# Patient Record
Sex: Male | Born: 1995 | Race: White | Hispanic: No | Marital: Single | Smoking: Never smoker
Health system: Southern US, Community
[De-identification: ages and names within clinical notes are randomized; demographics above are authoritative.]

---

## 2016-05-12 DIAGNOSIS — J0300 Acute streptococcal tonsillitis, unspecified: Secondary | ICD-10-CM | POA: Diagnosis not present

## 2016-05-12 DIAGNOSIS — J00 Acute nasopharyngitis [common cold]: Secondary | ICD-10-CM | POA: Diagnosis not present

## 2016-05-12 DIAGNOSIS — H10021 Other mucopurulent conjunctivitis, right eye: Secondary | ICD-10-CM | POA: Diagnosis not present

## 2016-09-05 ENCOUNTER — Other Ambulatory Visit: Payer: Self-pay | Admitting: Family Medicine

## 2016-09-05 ENCOUNTER — Ambulatory Visit
Admission: RE | Admit: 2016-09-05 | Discharge: 2016-09-05 | Disposition: A | Discharge status: Home / Self Care | Payer: BLUE CROSS/BLUE SHIELD | Source: Ambulatory Visit | Attending: Family Medicine | Admitting: Family Medicine

## 2016-09-05 ENCOUNTER — Ambulatory Visit
Admission: RE | Admit: 2016-09-05 | Discharge: 2016-09-05 | Disposition: A | Discharge status: Home / Self Care | Payer: BLUE CROSS/BLUE SHIELD | Source: Other Acute Inpatient Hospital | Attending: Family Medicine | Admitting: Family Medicine

## 2016-09-05 DIAGNOSIS — M25572 Pain in left ankle and joints of left foot: Secondary | ICD-10-CM | POA: Insufficient documentation

## 2016-09-05 DIAGNOSIS — Q799 Congenital malformation of musculoskeletal system, unspecified: Secondary | ICD-10-CM

## 2016-09-05 DIAGNOSIS — Y9366 Activity, soccer: Secondary | ICD-10-CM | POA: Insufficient documentation

## 2016-09-05 DIAGNOSIS — X501XXA Overexertion from prolonged static or awkward postures, initial encounter: Secondary | ICD-10-CM | POA: Insufficient documentation

## 2016-09-15 ENCOUNTER — Ambulatory Visit: Payer: BLUE CROSS/BLUE SHIELD | Admitting: Family Medicine

## 2016-09-18 ENCOUNTER — Ambulatory Visit: Payer: BLUE CROSS/BLUE SHIELD | Admitting: Family Medicine

## 2016-09-18 ENCOUNTER — Ambulatory Visit (INDEPENDENT_AMBULATORY_CARE_PROVIDER_SITE_OTHER): Payer: BLUE CROSS/BLUE SHIELD | Admitting: Family Medicine

## 2016-09-18 ENCOUNTER — Encounter: Payer: Self-pay | Admitting: Family Medicine

## 2016-09-18 DIAGNOSIS — S93402A Sprain of unspecified ligament of left ankle, initial encounter: Secondary | ICD-10-CM | POA: Diagnosis not present

## 2016-09-18 MED ORDER — NAPROXEN 500 MG PO TABS: 500.0000 mg | ORAL_TABLET | Freq: Two times a day (BID) | ORAL | 0 refills | Status: DC

## 2016-09-18 NOTE — Progress Notes (Signed)
Patient presents today with symptoms of left ankle pain and swelling. Patient states that he injured his ankle while playing soccer approximately 2 weeks ago. He states that the bruising is down and so is the swelling. He however has been unable to progress with activity. He denies any previous ankle sprains in the past. He denies any feelings of instability of the ankle. He was only in a walking boot for a few days. He is not been in any other splint for his ankle. He states that he does not have any pain with walking. He has only been taking Aleve occasionally.  ROS: Negative except mentioned above.  Vitals as per Epic GENERAL: NAD RESP: CTA B CARD: RRR MSK: Left ankle - no ecchymosis appreciated, no tenderness on the medial or lateral malleolus, tenderness localized to the medial aspect of the ankle, anterior tibiofibular ligament, as well as the ATFL, negative squeeze test, slight movement with drawer testing, negative Talar tilt, no tenderness of the fifth metatarsal, full range of motion, NV intact NEURO: CN II-XII grossly intact   A/P: Left Ankle Sprain- discussed with patient that likely high ankle sprain related injury, would've liked patient to be in a walking boot or ankle splint for longer, can wear an ASO now with a supportive tennis shoe, continue rehabilitation, if continues to not make improvement will follow up with Dr. Ardine Engiehl. Will discussed plan with trainer. Naprosyn when necessary.

## 2016-09-29 DIAGNOSIS — S93492A Sprain of other ligament of left ankle, initial encounter: Secondary | ICD-10-CM | POA: Diagnosis not present

## 2016-12-23 ENCOUNTER — Encounter: Payer: Self-pay | Admitting: Family Medicine

## 2016-12-23 ENCOUNTER — Ambulatory Visit (INDEPENDENT_AMBULATORY_CARE_PROVIDER_SITE_OTHER): Payer: BLUE CROSS/BLUE SHIELD | Admitting: Family Medicine

## 2016-12-23 DIAGNOSIS — M79671 Pain in right foot: Secondary | ICD-10-CM

## 2016-12-23 NOTE — Progress Notes (Signed)
Patient presents today with symptoms of right heel pain. Patient states that 4 weeks ago he jumped from 10 steps. Patient states that he was drinking alcohol. He states that he did have some bruising of the area after the injury. He did have difficulty weightbearing on the heel for a few days. A week and a half later patient did attempt play basketball and still had pain. He is also attempted to do some soccer activities and has had pain. He states the pain is improved but he still feels it. Denies any other injury. Patient is wearing heel cups which have helped his discomfort. Denies any previous injury to the heel in the past.  ROS: Negative except mentioned above. Vitals as per Epic.  GENERAL: NAD RESP: CTA B CARD: RRR MSK: Right heel - no ecchymosis or swelling appreciated, patient has discomfort along the lateral aspect of the calcaneus, he also has some discomfort along the lateral plantar surface of the calcaneus, no discomfort at the insertion of the plantar fascia on the calcaneus, full range of motion, NV intact NEURO: CN II-XII grossly intact   A/P: Right Calcaneus Injury -will evaluate with x-ray initially, will consider doing further imaging with CT or MRI, patient is having improvement with wearing the heel cup so he can continue this at this time, no athletic activity for now, will discuss plan with trainer.

## 2016-12-24 ENCOUNTER — Ambulatory Visit
Admission: RE | Admit: 2016-12-24 | Discharge: 2016-12-24 | Disposition: A | Discharge status: Home / Self Care | Payer: BLUE CROSS/BLUE SHIELD | Source: Ambulatory Visit | Attending: Family Medicine | Admitting: Family Medicine

## 2016-12-24 DIAGNOSIS — M79671 Pain in right foot: Secondary | ICD-10-CM

## 2017-01-12 ENCOUNTER — Ambulatory Visit: Payer: BLUE CROSS/BLUE SHIELD | Admitting: Family Medicine

## 2017-01-15 ENCOUNTER — Ambulatory Visit (INDEPENDENT_AMBULATORY_CARE_PROVIDER_SITE_OTHER): Payer: BLUE CROSS/BLUE SHIELD | Admitting: Family Medicine

## 2017-01-15 ENCOUNTER — Encounter: Payer: Self-pay | Admitting: Family Medicine

## 2017-01-15 DIAGNOSIS — M25572 Pain in left ankle and joints of left foot: Secondary | ICD-10-CM

## 2017-01-15 NOTE — Progress Notes (Signed)
Patient presents today for follow-up regarding left ankle injury that happened back in September. Patient describes the injury as a high ankle sprain. He states that he messed up to 10 games due to the ankle sprain. He states that his pain now happens on occasion and mostly happens when he is striking a ball. He has no pain with running or cutting. He denies any swelling of the ankle are decreased range of motion that he has noticed since the injury. He has taped the ankle for soccer which he seems helps some. Patient states that he did do rehabilitation initially for his ankle that has not continued to do this now. He states that his right heel pain is improving slowly. He still declines to have any further imaging such as MRI since it was not related to an athletic injury.  ROS: Negative except mentioned above.  GENERAL: NAD MSK: Left Ankle - no gross deformity, no ecchymosis or swelling appreciated, full range of motion, no tenderness to palpation, negative drawer, negative talar tilt, NV intact NEURO: CN II-XII grossly intact   A/P: Left ankle pain with previous history of high ankle sprain during the fall - discussed with patient that this time I would recommend him seeing PT and working on ankle rehabilitation, discussed using ASO versus taping, NSAIDs when necessary, if symptoms do not continue to improve with above over the next few weeks would recommend doing further imaging with MRI and seeing Dr. Ardine Engiehl.

## 2017-08-02 ENCOUNTER — Emergency Department: Payer: BLUE CROSS/BLUE SHIELD

## 2017-08-02 ENCOUNTER — Emergency Department
Admission: EM | Admit: 2017-08-02 | Discharge: 2017-08-02 | Disposition: A | Discharge status: Home / Self Care | Payer: BLUE CROSS/BLUE SHIELD | Attending: Emergency Medicine | Admitting: Emergency Medicine

## 2017-08-02 DIAGNOSIS — X58XXXA Exposure to other specified factors, initial encounter: Secondary | ICD-10-CM | POA: Diagnosis not present

## 2017-08-02 DIAGNOSIS — Y929 Unspecified place or not applicable: Secondary | ICD-10-CM | POA: Insufficient documentation

## 2017-08-02 DIAGNOSIS — Y9366 Activity, soccer: Secondary | ICD-10-CM | POA: Diagnosis not present

## 2017-08-02 DIAGNOSIS — M7989 Other specified soft tissue disorders: Secondary | ICD-10-CM | POA: Diagnosis not present

## 2017-08-02 DIAGNOSIS — Y999 Unspecified external cause status: Secondary | ICD-10-CM | POA: Diagnosis not present

## 2017-08-02 DIAGNOSIS — S90911A Unspecified superficial injury of right ankle, initial encounter: Secondary | ICD-10-CM | POA: Diagnosis present

## 2017-08-02 DIAGNOSIS — M25571 Pain in right ankle and joints of right foot: Secondary | ICD-10-CM | POA: Diagnosis not present

## 2017-08-02 DIAGNOSIS — S93401A Sprain of unspecified ligament of right ankle, initial encounter: Secondary | ICD-10-CM | POA: Insufficient documentation

## 2017-08-02 DIAGNOSIS — S99911A Unspecified injury of right ankle, initial encounter: Secondary | ICD-10-CM | POA: Diagnosis not present

## 2017-08-02 NOTE — ED Notes (Signed)
First Nurse: pt with ankle pain, ambulatory to stat desk with no difficulty noted.

## 2017-08-02 NOTE — ED Triage Notes (Signed)
Pt states playing in a soccer game last night and hurting his ankle.  Upon waking this morning the pain is worse.  Pt wanted to get some X-rays of ankle.

## 2017-08-02 NOTE — Discharge Instructions (Signed)
Ice and elevate her ankle as needed for comfort. Wear your splint that you have at your facility and use crutches as needed for walking. Follow-up with Dr. Allena Katz for your ankle injury and pain. Begin using ibuprofen and continue until seen by Dr. Allena Katz.

## 2017-08-02 NOTE — ED Provider Notes (Signed)
[facility] Emergency Department Provider Note  ____________________________________________   None    (approximate)  I have reviewed the triage vital signs and the nursing notes.   HISTORY  Chief Complaint Foot Injury   HPI Kevin Ruiz is a 21 y.o. male states that he injured his right ankle while playing soccer last evening. Patient states pain is worse this morning on waking up. He continues to ambulate without assistance. Patient denies any previous fractures to his ankle. He rates his pain as a 5/10.  History reviewed. No pertinent past medical history.  There are no active problems to display for this patient.   History reviewed. No pertinent surgical history.  Prior to Admission medications   Not on File    Allergies Patient has no known allergies.  Family History  Problem Relation Age of Onset  . Diabetes Maternal Grandfather     Social History Social History  Substance Use Topics  . Smoking status: Never Smoker  . Smokeless tobacco: Never Used  . Alcohol use 3.6 oz/week    6 Cans of beer per week    Review of Systems Constitutional: No fever/chills Cardiovascular: Denies chest pain. Respiratory: Denies shortness of breath. Gastrointestinal:   No nausea, no vomiting.   Musculoskeletal: Positive for right ankle pain. Neurological: Negative for  focal weakness or numbness.   ____________________________________________   PHYSICAL EXAM:  VITAL SIGNS: ED Triage Vitals  Enc Vitals Group     BP 08/02/17 0914 123/70     Pulse Rate 08/02/17 0914 78     Resp 08/02/17 0914 16     Temp 08/02/17 0914 97.8 F (36.6 C)     Temp Source 08/02/17 0914 Oral     SpO2 08/02/17 0914 100 %     Weight 08/02/17 0911 170 lb (77.1 kg)     Height 08/02/17 0911 5\' 10"  (1.778 m)     Head Circumference --      Peak Flow --      Pain Score 08/02/17 0910 5     Pain Loc --      Pain Edu? --      Excl. in GC? --    Constitutional:  Alert and oriented. Well appearing and in no acute distress. Eyes: Conjunctivae are normal.  Neck: No stridor.   Cardiovascular: Normal rate, regular rhythm. Grossly normal heart sounds.  Good peripheral circulation. Respiratory: Normal respiratory effort.  No retractions. Lungs CTAB. Musculoskeletal: On examination of the right ankle there is no gross deformity noted. There is tenderness on palpation of the medial malleolus no soft tissue swelling is appreciated. Range of motion is without restriction. Patient is able to ambulate and bear weight without assistance. Neurologic:  Normal speech and language. No gross focal neurologic deficits are appreciated.  Skin:  Skin is warm, dry and intact.  Psychiatric: Mood and affect are normal. Speech and behavior are normal.  ____________________________________________   LABS (all labs ordered are listed, but only abnormal results are displayed)  Labs Reviewed - No data to display   RADIOLOGY  Dg Ankle Complete Right  Result Date: 08/02/2017 CLINICAL DATA:  Soccer injury last night.  Swelling and pain. EXAM: RIGHT ANKLE - COMPLETE 3+ VIEW COMPARISON:  None. FINDINGS: A calcification distal to the fibula is well corticated and thought to represent sequela of remote avulsion injury. No acute fractures are seen. IMPRESSION: No acute fracture identified. Electronically Signed   By: Gerome Sam III M.D   On: 08/02/2017 09:46  ____________________________________________   PROCEDURES  Procedure(s) performed: None  Procedures  Critical Care performed: No  ____________________________________________   INITIAL IMPRESSION / ASSESSMENT AND PLAN / ED COURSE  Pertinent labs & imaging results that were available during my care of the patient were reviewed by me and considered in my medical decision making (see chart for details).  Patient was reassured that x-ray did not show any bony abnormalities. He states that crutches and ankle splint  are available to him at the [place]. Patient was ambulatory without assistance from the department. He is aware that he will take ibuprofen as needed for pain. Ice and elevation was discussed.   ____________________________________________   FINAL CLINICAL IMPRESSION(S) / ED DIAGNOSES  Final diagnoses:  Sprain of right ankle, unspecified ligament, initial encounter      NEW MEDICATIONS STARTED DURING THIS VISIT:  Discharge Medication List as of 08/02/2017 10:17 AM       Note:  This document was prepared using Dragon voice recognition software and may include unintentional dictation errors.    Tommi Rumps, PA-C 08/02/17 1249    Jeanmarie Plant, MD 08/03/17 [phone]

## 2018-11-18 IMAGING — CR DG ANKLE COMPLETE 3+V*R*
1 series · 3 of 3 positions shown · non-contrast
Comparison: None.

CLINICAL DATA: Soccer injury last night.  Swelling and pain.

EXAM:
RIGHT ANKLE - COMPLETE 3+ VIEW

[Series 1: dg ankle complete right · 0.14mm/px · 3 of 3 slices shown]
[im 1/3]
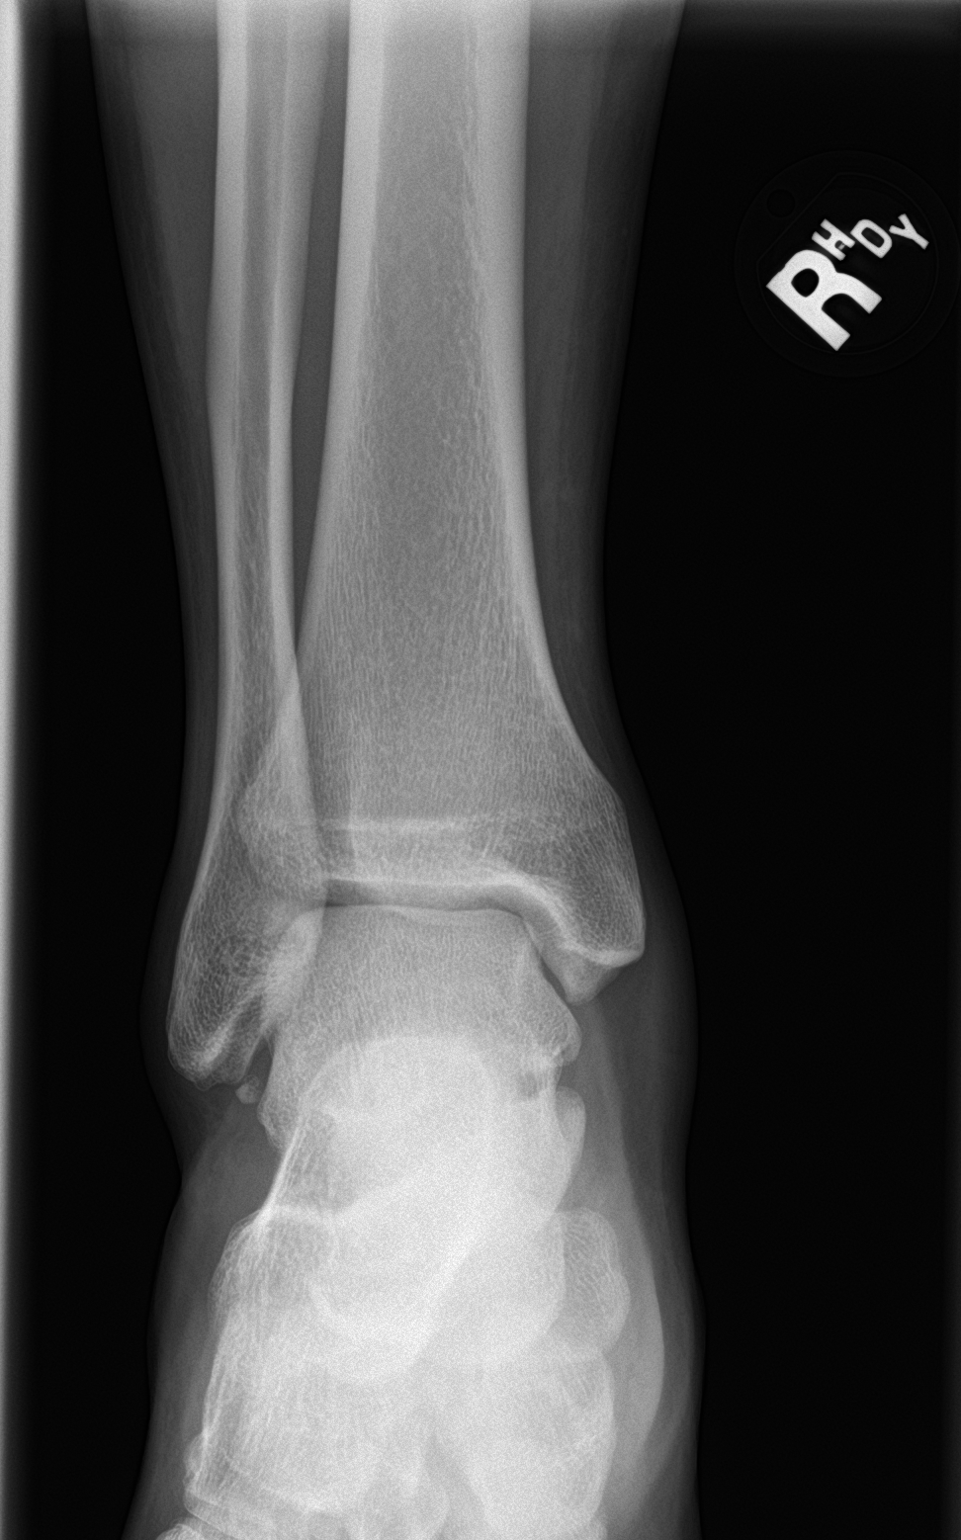
[im 2/3]
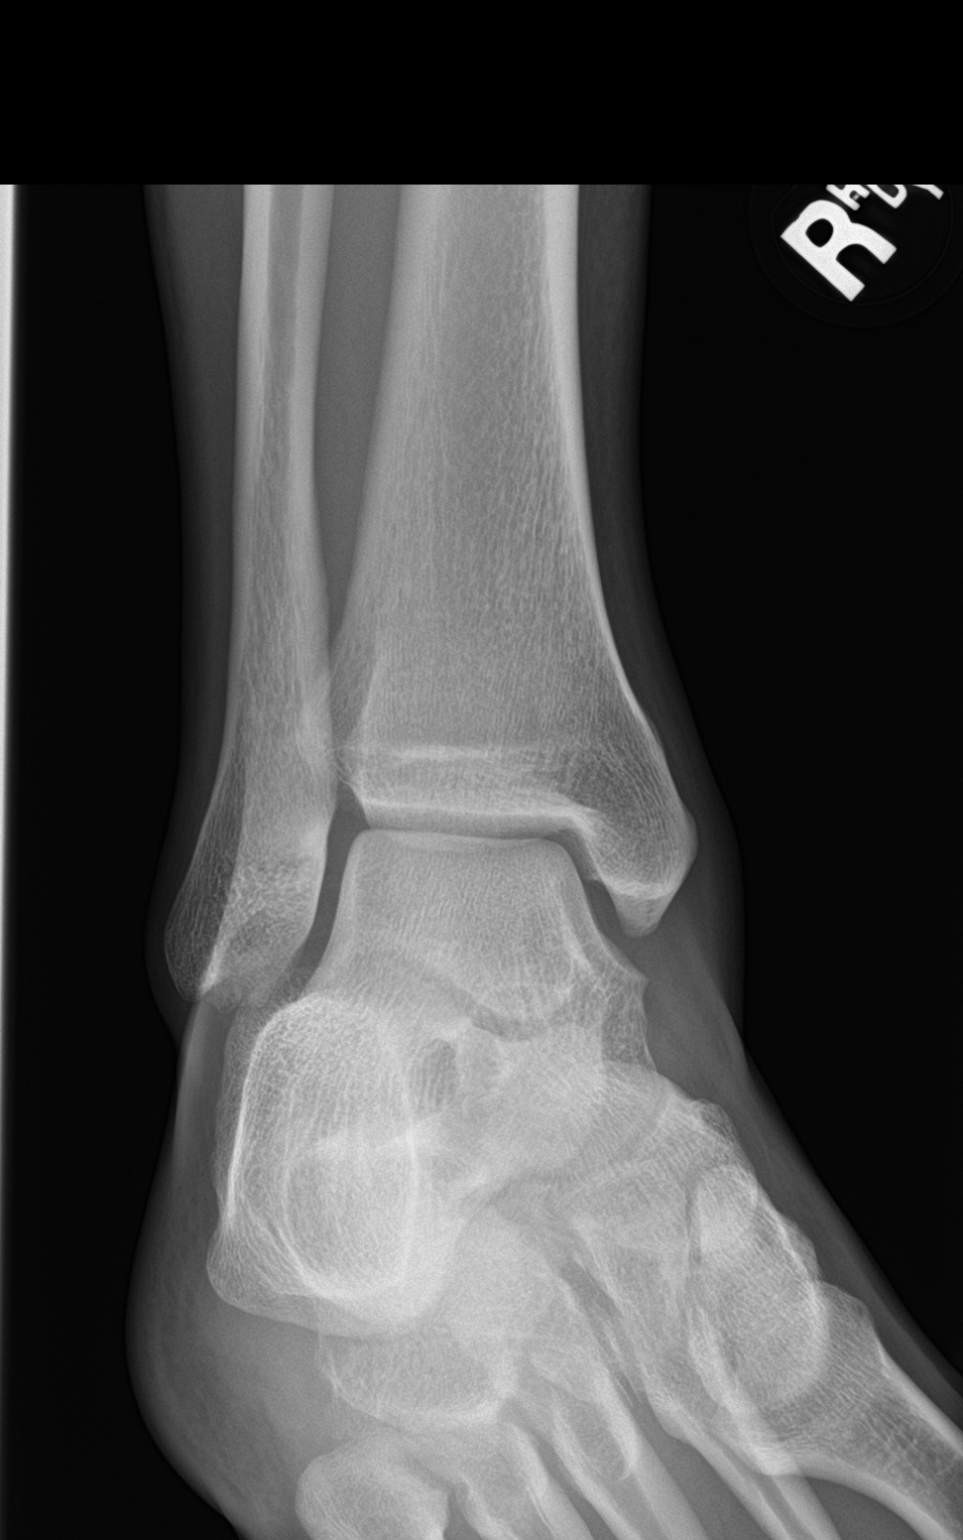
[im 3/3]
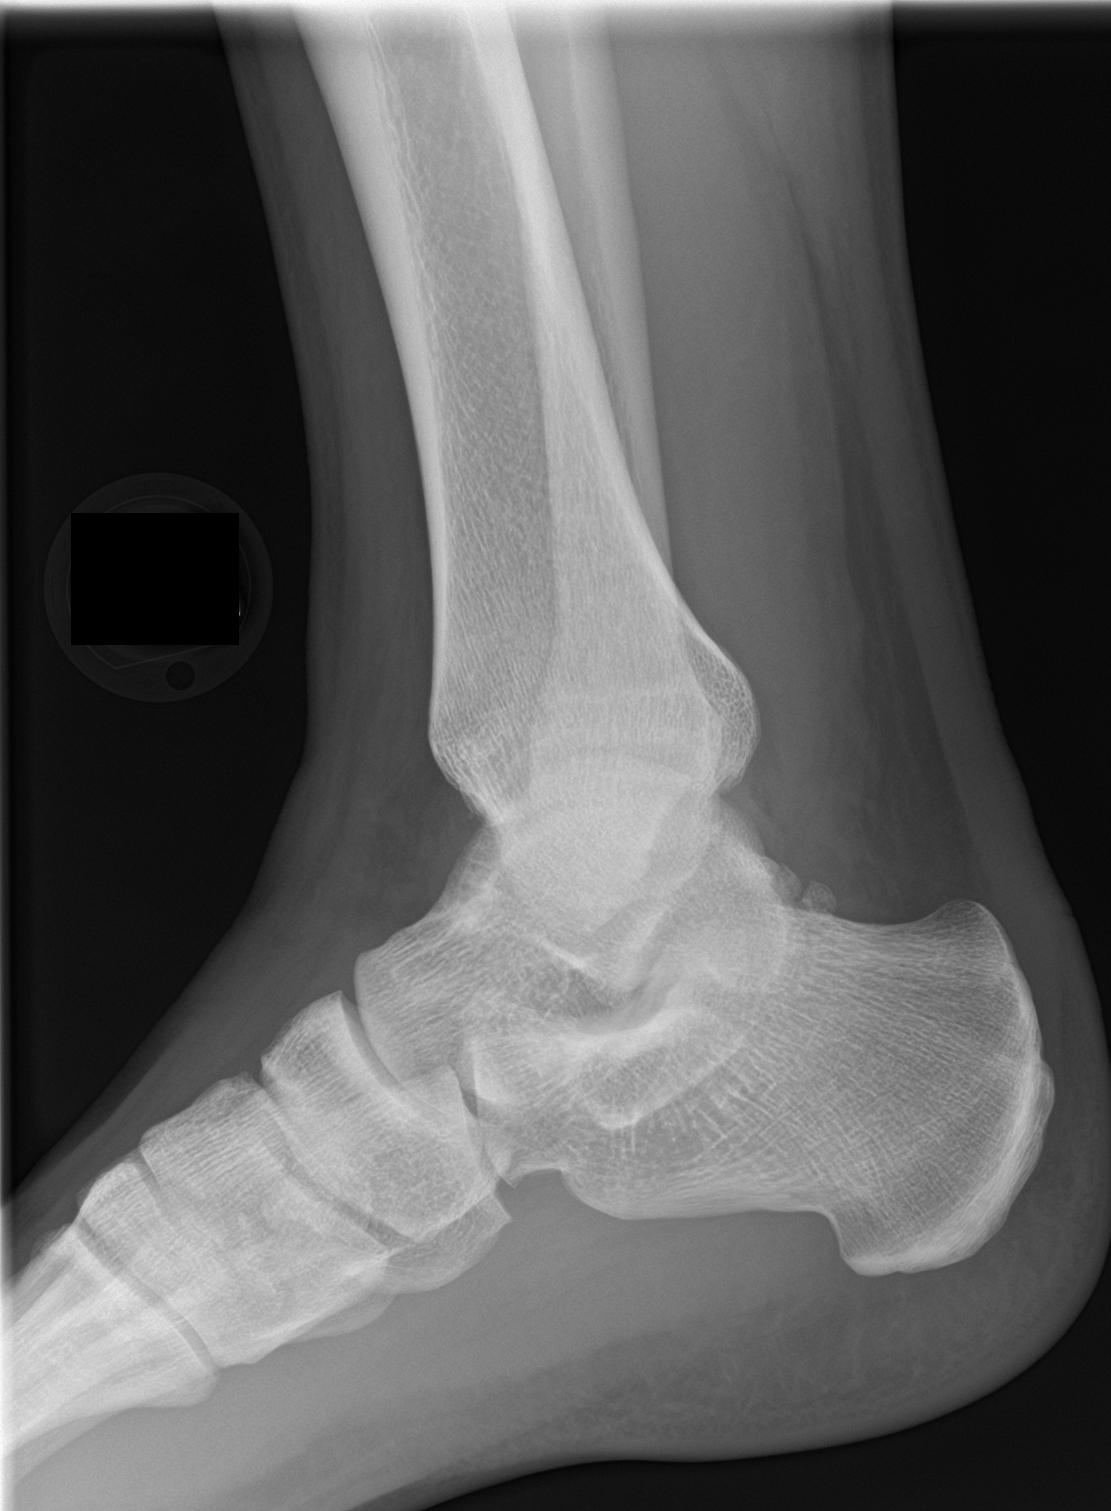

[3 of 3 positions shown; findings below may reference images not displayed]

FINDINGS: A calcification distal to the fibula is well corticated and thought
to represent sequela of remote avulsion injury. No acute fractures
are seen.
IMPRESSION: No acute fracture identified.

## 2019-08-11 DIAGNOSIS — Z20828 Contact with and (suspected) exposure to other viral communicable diseases: Secondary | ICD-10-CM | POA: Diagnosis not present
